# Patient Record
Sex: Female | Born: 1935 | Race: White | Hispanic: No | Smoking: Never smoker
Health system: Southern US, Community
[De-identification: ages and names within clinical notes are randomized; demographics above are authoritative.]

## PROBLEM LIST (undated history)

## (undated) DIAGNOSIS — M1990 Unspecified osteoarthritis, unspecified site: Secondary | ICD-10-CM

---

## 2009-11-30 ENCOUNTER — Encounter: Admission: RE | Admit: 2009-11-30 | Discharge: 2009-11-30 | Payer: Self-pay | Admitting: Orthopedic Surgery

## 2013-08-04 ENCOUNTER — Emergency Department (HOSPITAL_COMMUNITY)
Admission: EM | Admit: 2013-08-04 | Discharge: 2013-08-04 | Disposition: A | Discharge status: Home / Self Care | Payer: Medicare Other | Attending: Emergency Medicine | Admitting: Emergency Medicine

## 2013-08-04 ENCOUNTER — Emergency Department (HOSPITAL_COMMUNITY): Payer: Medicare Other

## 2013-08-04 ENCOUNTER — Encounter (HOSPITAL_COMMUNITY): Payer: Self-pay | Admitting: Emergency Medicine

## 2013-08-04 DIAGNOSIS — F0391 Unspecified dementia with behavioral disturbance: Secondary | ICD-10-CM | POA: Insufficient documentation

## 2013-08-04 DIAGNOSIS — R0989 Other specified symptoms and signs involving the circulatory and respiratory systems: Secondary | ICD-10-CM

## 2013-08-04 DIAGNOSIS — R6889 Other general symptoms and signs: Secondary | ICD-10-CM | POA: Insufficient documentation

## 2013-08-04 DIAGNOSIS — F03918 Unspecified dementia, unspecified severity, with other behavioral disturbance: Secondary | ICD-10-CM | POA: Insufficient documentation

## 2013-08-04 HISTORY — DX: Unspecified osteoarthritis, unspecified site: M19.90

## 2013-08-04 NOTE — ED Provider Notes (Signed)
CSN: [id number]     Arrival date & time 08/04/13  1854 History   First MD Initiated Contact with Patient 08/04/13 1855     Chief Complaint  Patient presents with  . Aspiration   (Consider location/radiation/quality/duration/timing/severity/associated sxs/prior Treatment) HPI Comments: 80 year old female with history of dementia sent from [facility] assisted living facility after an apparent possible aspiration. The patient was sitting down for a meal and had productive white "foam". The nursing staff noticed the patient seemed to be coughing up this foam. There is no obvious food. Could also been vomit. The patient does have trouble with certain foods and is not on a regular diet. The patient was encouraged to cough multiple times and afterwards seemed appear a little more ill but recovered and was her normal self. He called EMS as a precaution the major she is having okay swallowing and no aspiration. When she was sent to the ER the patient was in her normal mental status. The history is otherwise unobtainable the patient due to her dementia.   No past medical history on file. No past surgical history on file. No family history on file. History  Substance Use Topics  . Smoking status: Not on file  . Smokeless tobacco: Not on file  . Alcohol Use: Not on file   OB History   No data available     Review of Systems  Unable to perform ROS: Dementia    Allergies  Review of patient's allergies indicates not on file.  Home Medications  No current outpatient prescriptions on file. BP 126/72  Pulse 92  Temp(Src) 97.7 F (36.5 C) (Oral)  Resp 18  SpO2 97% Physical Exam  Nursing note and vitals reviewed. Constitutional: She appears well-developed and well-nourished.  HENT:  Head: Normocephalic and atraumatic.  Right Ear: External ear normal.  Left Ear: External ear normal.  Nose: Nose normal.  Eyes: Right eye exhibits no discharge. Left eye exhibits no discharge.   Cardiovascular: Normal rate, regular rhythm and normal heart sounds.   No murmur heard. Pulmonary/Chest: Effort normal and breath sounds normal.  Abdominal: Soft. There is no tenderness.  Neurological: She is alert. She is disoriented. GCS eye subscore is 4. GCS verbal subscore is 4. GCS motor subscore is 6.  Skin: Skin is warm and dry.    ED Course  Procedures (including critical care time) Labs Review Labs Reviewed - No data to display Imaging Review Dg Chest 2 View  08/04/2013   CLINICAL DATA:  Choking sensation  EXAM: CHEST  2 VIEW  COMPARISON:  None.  FINDINGS: There is a small calcified granuloma in the right upper lobe. Lungs are otherwise clear. Heart size and pulmonary vascularity are normal. No adenopathy. There is a moderate hiatal hernia.  IMPRESSION: Hiatal hernia present. No edema or consolidation. Small granuloma right upper lobe.   Electronically Signed   By: Bretta Bang M.D.   On: 08/04/2013 20:10    EKG Interpretation    Date/Time:  Thursday August 04 2013 19:19:39 EST Ventricular Rate:  92 PR Interval:  145 QRS Duration: 113 QT Interval:  373 QTC Calculation: 461 R Axis:   -58 Text Interpretation:  Sinus rhythm Left anterior fascicular block LVH with secondary repolarization abnormality Anterior Q waves, possibly due to LVH No old tracing to compare Confirmed by Ancil Dewan  MD, Hershel Corkery ([id number]) on 08/04/2013 8:23:42 PM            MDM   1. Choking episode    Patient is  well-appearing here and in no acute distress. Has sats greater than 94% on continuous pulse oximetry. Chest x-ray shows no signs of aspiration or pneumonia. Feel that at this time given her normal exam and no acute AMS, cough or vomiting here that she can go back to NH. Her EKG is abnormal but no sign of a cause of acute syncope or near-syncope. Her transient altered mental status seems related to prolonged coughing. She's not had any new respiratory symptoms here and I feel she is stable to go  back.    Audree CamelScott T Lugenia Assefa, MD 08/04/13 2033

## 2013-08-04 NOTE — ED Notes (Signed)
Patient coming in via EMS, Patient from dementia care facility, per staff patient had an episode of respiratory distress at the table, patient coughed and produce whitish phlegm with possible food in it, during this episode patient became unresponsive for a brief moment, after which she became quickly alert.  Patient appears in no acute distress at this time.  spo2 94% on ra

## 2013-08-04 NOTE — ED Notes (Signed)
Pt c/o pain in right foot, Pt stated that "it hurts when I put a shoe on"

## 2013-08-04 NOTE — Discharge Instructions (Signed)
Choking, Adult  Choking occurs when a food or object gets stuck in the throat or trachea, blocking the airway. If the airway is partly blocked, coughing will usually cause the food or object to come out. If the airway is completely blocked, immediate action is needed to help it come out. A complete airway blockage is life-threatening because it causes breathing to stop. Choking is a true medical emergency that requires fast, appropriate action by anyone available.  SIGNS OF AIRWAY BLOCKAGE  There is a partial airway blockage if you or the person who is choking is:    Able to breathe and speak.   Coughing loudly.   Making loud noises.  There is a complete airway blockage if you or the person who is choking is:    Unable to breathe.   Making soft or high-pitched sounds while breathing.   Unable to cough or coughing weakly, ineffectively, or silently.   Unable to cry, speak, or make sounds.   Turning blue.   Holding the neck with both arms. This is the universal sign of choking.  WHAT TO DO IF CHOKING OCCURS  If there is a partial airway blockage, allow coughing to clear the airway. Do not try to drink until the food or object comes out. If someone else has a partial airway blockage, do not interfere. Stay with him or her and watch for signs of complete airway blockage until the food or object comes out.   If there is a complete airway blockage or if there is a partial airway blockage and the food or object does not come out, perform abdominal thrusts (also referred to as the Heimlich maneuver). Abdominal thrusts are used to create an artificial cough to try to clear the airway. Performing abdominal thrusts is part of a series of steps that should be done to help someone who is choking. Abdominal thrusts are usually done by someone else, but if you are alone, you can perform abdominal thrusts on yourself. Follow the procedure below that best fits your situation.   IF SOMEONE ELSE IS CHOKING:  For a conscious  adult:   1. Ask the person whether he or she is choking. If the person nods, continue to step 2.  2. Stand or kneel behind the person and lean him or her forward slightly.  3. Make a fist with 1 hand, put your arms around the person, and grasp your fist with your other hand. Place the thumb side of your fist in the person's abdomen, just below the ribs.  4. Press inward and upward with both hands.  5. Repeat this maneuver until the object comes out and the person is able to breathe or until the person loses consciousness.  For an unconcious adult:  1. Shout for help. If someone responds, have him or her call local emergency services (911 in [place] If no one responds, call local emergency services yourself if possible.  2. Begin CPR, starting with compressions. Every time you open the airway to give rescue breaths, open the person's mouth. If you can see the food or object and it can be easily pulled out, remove it with your fingers.  3. After 5 cycles or 2 minutes of CPR, call local emergency services (911 in [place] if you or someone else did not already call.  For a conscious adult who is obese or in the later stages of pregnancy:  Abdominal thrusts may not be effective when helping people who are in the later stages   of pregnancy or who are obese. In these instances, chest thrusts can be used.   1. Ask the person whether he or she is choking. If the person nods and has signs of complete airway blockage, continue to step 2.  2. Stand behind the person and wrap your arms around his or her chest (with your arms under the person's armpits).  3. Make a fist with 1 hand. Place the thumb side of your fist on the middle of the person's breastbone.  4. Grab your fist with your other hand and thrust backward. Continue this until the object comes out or until the person becomes unconscious.  For an unconscious adult who is obese or in the later stages of pregnancy:   1. Shout for help. If someone responds, have him or her call  local emergency services (911 in [place] If no one responds, call local emergency services yourself if possible.  2. Begin CPR, starting with compressions. Every time you open the airway to give rescue breaths, open the person's mouth. If you can see the food or object and it can be easily pulled out, remove it with your fingers.  3. After 5 cycles or 2 minutes of CPR, call local emergency services (911 in [place] if you or someone else did not already call.  Note that abdominal thrusts (below the rib cage) should be used for a pregnant woman if possible. This should be possible until the later stages of pregnancy when there is no longer enough room between the enlarging uterus and the rib cage to perform the maneuver. At that point, chest thrusts must be used as described.  IF YOU ARE CHOKING:  1. Call local emergency services (911 in [place] if near a landline. Do not worry about communicating what is happening. Do not hang up the phone. Someone may be sent to help you anyway.  2. Make a fist with 1 hand. Put the thumb side of the fist against your stomach, just above the belly button and well below the breastbone. If you are pregnant or obese, put your fist on your chest instead, just below the breastbone and just above your lowest ribs.  3. Hold your fist with your other hand and bend over a hard surface, such as a table or chair.  4. Forcefully push your fist in and up.  5. Continue to do this until the food or object comes out.  PREVENTION   To be prepared if choking occurs, learn how to correctly perform abdominal thrusts and give CPR by taking a certified first-aid training course.   SEEK IMMEDIATE MEDICAL CARE IF:   You have a fever after choking stops.   You have problems breathing after choking stops.   You received the Heimlich maneuver.  MAKE SURE YOU:   Understand these instructions.   Watch your condition.   Get help right away if you are not doing well or get worse.  Document Released: 08/21/2004  Document Revised: 04/07/2012 Document Reviewed: 02/24/2012  [employer] Patient Information 2014 [employer].

## 2013-08-24 ENCOUNTER — Other Ambulatory Visit (HOSPITAL_COMMUNITY): Payer: Self-pay | Admitting: Geriatric Medicine

## 2013-08-24 DIAGNOSIS — R1310 Dysphagia, unspecified: Secondary | ICD-10-CM

## 2013-08-30 ENCOUNTER — Ambulatory Visit (HOSPITAL_COMMUNITY)
Admission: RE | Admit: 2013-08-30 | Discharge: 2013-08-30 | Disposition: A | Discharge status: Home / Self Care | Payer: Medicare Other | Source: Ambulatory Visit | Attending: Geriatric Medicine | Admitting: Geriatric Medicine

## 2013-08-30 DIAGNOSIS — E538 Deficiency of other specified B group vitamins: Secondary | ICD-10-CM | POA: Insufficient documentation

## 2013-08-30 DIAGNOSIS — D649 Anemia, unspecified: Secondary | ICD-10-CM | POA: Insufficient documentation

## 2013-08-30 DIAGNOSIS — R1310 Dysphagia, unspecified: Secondary | ICD-10-CM

## 2013-08-30 DIAGNOSIS — E039 Hypothyroidism, unspecified: Secondary | ICD-10-CM | POA: Insufficient documentation

## 2013-08-30 DIAGNOSIS — R627 Adult failure to thrive: Secondary | ICD-10-CM | POA: Insufficient documentation

## 2013-08-30 DIAGNOSIS — R1313 Dysphagia, pharyngeal phase: Secondary | ICD-10-CM | POA: Insufficient documentation

## 2013-08-30 DIAGNOSIS — R1311 Dysphagia, oral phase: Secondary | ICD-10-CM | POA: Insufficient documentation

## 2013-08-30 NOTE — Procedures (Addendum)
Objective Swallowing Evaluation: Modified Barium Swallowing Study  Patient Details  Name: Priscilla Jimenez MRN: [id number] Date of Birth: 1916-11-29  Today's Date: 08/30/2013 Time: 6962-9528 SLP Time Calculation (min): 42 min  Past Medical History:  Past Medical History  Diagnosis Date  . Arthritis    Past Surgical History: No past surgical history on file. HPI:  80 yo female referred by SLP for MBS due to concern pt may be aspirating.  Pt had episode recently where she was coughing and bringing up "foam".  Family present (daughter Decarla and her spouse) report pt holding phlegm in her mouth frequently and frequent "choking" episodes with and without po intake.  Pt PMH + for HOH, anemia, B12 deficiency, hypothyrodism, FTT, left fib plateau fx, frequent UTI hx, ? cogntiive deficts per ED report.  Pt was seen in the ED 08/04/13 for episode of "choking" and was released.  .     Assessment / Plan / Recommendation Clinical Impression  Dysphagia Diagnosis: Moderate oral phase dysphagia;Mild pharyngeal phase dysphagia (suspect component of esophageal issues)   Clinical impression: Pt presents with moderate oral, mild pharyngeal and suspected esophageal dysphagia without frank aspiration during tested.  Oral delays noted with pt allowing pudding/cracker bolus to sit in anterior oral cavity, effectively making eating laborious for this pt- ? d/t weakness. Pt with mild tongue base and vallecular residuals likely due to decreased intraoral bolus pressure - pt with delayed reflexive swallows effective to clear.  Pt did cough a few times during testing without barium visualized in larynx/trachea at that time.  Trace upper laryngeal infiltration noted x1 - uncertain of consistency - effectively cleared with cued cough.    Suspect pt's symptoms of bring up "foam/phlegm" may be consistent with esophageal issues including known hiatal hernia(dx on CXR 08/04/13).  Radiologist was not present during MBS to confirm  findings.  Also ? if pt is swallowing her saliva routinely, instructed her to make attempts to swallow saliva every few minutes.    Pt was unable to perform cued dry swallow in timely fashion and oral deficits were apparent with solids/puddings, recommend continue SLP to address dysphagia.  Educated family and pt thoroghly to findings and recommendations.  Pt may benefit from dedicated evaluation of esophagus if MD desires.      Treatment Recommendation  Defer treatment plan to SLP at (Comment) [facility])    Diet Recommendation Dysphagia 3 (Mechanical Soft);Thin liquid   Liquid Administration via: Cup;Straw Medication Administration: Whole meds with puree (if large crush - follow with liquids) Supervision: Patient able to self feed;Full supervision/cueing for compensatory strategies Compensations: Slow rate;Small sips/bites (intermittent cough and dry swallow) Postural Changes and/or Swallow Maneuvers: Seated upright 90 degrees;Upright 30-60 min after meal    Other  Recommendations Recommended Consults: Consider esophageal assessment Oral Care Recommendations: Oral care before and after PO   Follow Up Recommendations              SLP Swallow Goals     General Date of Onset: 08/30/13 HPI: 80 yo female referred by SLP for MBS due to concern pt may be aspirating.  Pt had episode recently where she was coughing and bringing up "foam".  Family present (daughter Deanette and her spouse) report pt holding phlegm in her mouth frequently and frequent "choking" episodes with and without po intake.  Pt PMH + for HOH, anemia, B12 deficiency, hypothyrodism, FTT, left fib plateau fx, frequent UTI hx, ? cogntiive deficts per ED report.  Pt was seen in the ED  08/04/13 for episode of "choking" and was released.  . Type of Study: Modified Barium Swallowing Study Reason for Referral: Objectively evaluate swallowing function Diet Prior to this Study: Regular Respiratory Status: Room air History of  Recent Intubation: No Behavior/Cognition: Alert;Cooperative;Hard of hearing;Pleasant mood Oral Cavity - Dentition: Dentures, top;Dentures, bottom (pt states dentures fit well) Oral Motor / Sensory Function: Within functional limits Self-Feeding Abilities: Able to feed self Patient Positioning: Upright in chair Baseline Vocal Quality: Hoarse;Low vocal intensity Volitional Cough: Strong Volitional Swallow: Unable to elicit Anatomy: Within functional limits Pharyngeal Secretions: Not observed secondary MBS    Reason for Referral Objectively evaluate swallowing function   Oral Phase Oral Preparation/Oral Phase Oral Phase: Impaired Oral - Nectar Oral - Nectar Teaspoon: Weak lingual manipulation;Delayed oral transit;Reduced posterior propulsion Oral - Nectar Cup: Weak lingual manipulation;Delayed oral transit;Lingual/palatal residue;Reduced posterior propulsion Oral - Nectar Straw: Not tested Oral - Thin Oral - Thin Teaspoon: Delayed oral transit;Reduced posterior propulsion;Weak lingual manipulation Oral - Thin Cup: Weak lingual manipulation;Lingual/palatal residue;Delayed oral transit;Reduced posterior propulsion Oral - Thin Straw: Weak lingual manipulation;Reduced posterior propulsion;Delayed oral transit;Lingual/palatal residue Oral - Solids Oral - Puree: Weak lingual manipulation;Reduced posterior propulsion;Delayed oral transit;Lingual/palatal residue Oral - Regular: Weak lingual manipulation;Impaired mastication;Reduced posterior propulsion;Delayed oral transit;Lingual/palatal residue Oral - Pill: Weak lingual manipulation;Delayed oral transit;Reduced posterior propulsion Oral Phase - Comment Oral Phase - Comment: pt allows boluses to be retained in anterior oral cavity with delays in transiting, mild tongue base and vallecular stasis noted due to weakness   Pharyngeal Phase Pharyngeal Phase Pharyngeal Phase: Impaired Pharyngeal - Nectar Pharyngeal - Nectar Teaspoon: Premature  spillage to valleculae;Pharyngeal residue - valleculae Pharyngeal - Nectar Cup: Premature spillage to valleculae;Pharyngeal residue - valleculae Pharyngeal - Thin Pharyngeal - Thin Teaspoon: Premature spillage to pyriform sinuses Pharyngeal - Thin Cup: Premature spillage to valleculae;Pharyngeal residue - valleculae Pharyngeal - Thin Straw: Premature spillage to pyriform sinuses;Pharyngeal residue - valleculae Pharyngeal - Solids Pharyngeal - Puree: Premature spillage to valleculae Pharyngeal - Regular: Premature spillage to valleculae Pharyngeal Phase - Comment Pharyngeal Comment: trace laryngeal penetration noted during study x1 to upper larynx- could not definitively determine consistency - suspect oral residuals premature spill into larynx, cued cough effective to remove trace penetrates, effective breath hold prior to swallow of thin noted intermittently   Cervical Esophageal Phase    GO    Cervical Esophageal Phase Cervical Esophageal Phase: Impaired Cervical Esophageal Phase - Comment Cervical Esophageal Comment: appearance of prominent cricopharyngeus, did not impair barium flow, distally barium appeared to clear slowly,  pt appeared with hiatal hernia (that was confirmed on previous CXR completed 08/04/13), barium tablet given with pudding appeared to lodge at distal esophagus (suspect at GE- portion of stomach above diaphragm)  WITHOUT pt sensation, multiple extra swallows of thin barium and warm water was not effective to clear tablet into stomach, advised pt that it would dissolve, SLP suspects pt's issues with coughing on phlegm may be consistent with esophageal deficits    Functional Assessment Tool Used: mbs. clinical judgement Functional Limitations: Swallowing Swallow Current Status ([id number]): At least 20 percent but less than 40 percent impaired, limited or restricted Swallow Goal Status [phone]([id number]): At least 20 percent but less than 40 percent impaired, limited or  restricted Swallow Discharge Status [phone](G8998): At least 20 percent but less than 40 percent impaired, limited or restricted    Donavan Burnetamara Joshua Zeringue, MS [facility] SLP [phone]

## 2013-10-26 ENCOUNTER — Emergency Department (HOSPITAL_COMMUNITY)
Admission: EM | Admit: 2013-10-26 | Discharge: 2013-10-26 | Disposition: A | Discharge status: Home / Self Care | Payer: Medicare Other | Attending: Emergency Medicine | Admitting: Emergency Medicine

## 2013-10-26 ENCOUNTER — Encounter (HOSPITAL_COMMUNITY): Payer: Self-pay | Admitting: Emergency Medicine

## 2013-10-26 DIAGNOSIS — Z8739 Personal history of other diseases of the musculoskeletal system and connective tissue: Secondary | ICD-10-CM | POA: Diagnosis not present

## 2013-10-26 DIAGNOSIS — R112 Nausea with vomiting, unspecified: Secondary | ICD-10-CM | POA: Diagnosis not present

## 2013-10-26 DIAGNOSIS — R197 Diarrhea, unspecified: Secondary | ICD-10-CM | POA: Insufficient documentation

## 2013-10-26 DIAGNOSIS — Z79899 Other long term (current) drug therapy: Secondary | ICD-10-CM | POA: Insufficient documentation

## 2013-10-26 LAB — CBC WITH DIFFERENTIAL/PLATELET
BASOS PCT: 0 % (ref 0–1)
Basophils Absolute: 0 10*3/uL (ref 0.0–0.1)
Eosinophils Absolute: 0.1 10*3/uL (ref 0.0–0.7)
Eosinophils Relative: 1 % (ref 0–5)
HEMATOCRIT: 44.4 % (ref 36.0–46.0)
HEMOGLOBIN: 15.2 g/dL — AB (ref 12.0–15.0)
LYMPHS ABS: 0.9 10*3/uL (ref 0.7–4.0)
LYMPHS PCT: 8 % — AB (ref 12–46)
MCH: 30.8 pg (ref 26.0–34.0)
MCHC: 34.2 g/dL (ref 30.0–36.0)
MCV: 90.1 fL (ref 78.0–100.0)
Monocytes Absolute: 0.6 10*3/uL (ref 0.1–1.0)
Monocytes Relative: 6 % (ref 3–12)
Neutro Abs: 8.9 10*3/uL — ABNORMAL HIGH (ref 1.7–7.7)
Neutrophils Relative %: 85 % — ABNORMAL HIGH (ref 43–77)
PLATELETS: 145 10*3/uL — AB (ref 150–400)
RBC: 4.93 MIL/uL (ref 3.87–5.11)
RDW: 15 % (ref 11.5–15.5)
WBC: 10.5 10*3/uL (ref 4.0–10.5)

## 2013-10-26 LAB — BASIC METABOLIC PANEL
BUN: 29 mg/dL — ABNORMAL HIGH (ref 6–23)
CO2: 23 meq/L (ref 19–32)
CREATININE: 0.83 mg/dL (ref 0.50–1.10)
Calcium: 9.5 mg/dL (ref 8.4–10.5)
Chloride: 100 mEq/L (ref 96–112)
GFR, EST AFRICAN AMERICAN: 67 mL/min — AB (ref >90)
GFR, EST NON AFRICAN AMERICAN: 58 mL/min — AB (ref >90)
GLUCOSE: 80 mg/dL (ref 70–99)
Potassium: 3.9 mEq/L (ref 3.7–5.3)
Sodium: 137 mEq/L (ref 137–147)

## 2013-10-26 MED ORDER — ONDANSETRON HCL 4 MG/2ML IJ SOLN
4.0000 mg | Freq: Once | INTRAMUSCULAR | Status: AC
  Administered 2013-10-26: 4 mg via INTRAVENOUS
  Filled 2013-10-26: qty 2

## 2013-10-26 MED ORDER — SODIUM CHLORIDE 0.9 % IV SOLN
1000.0000 mL | Freq: Once | INTRAVENOUS | Status: AC
  Administered 2013-10-26: 1000 mL via INTRAVENOUS

## 2013-10-26 MED ORDER — SODIUM CHLORIDE 0.9 % IV SOLN
1000.0000 mL | INTRAVENOUS | Status: DC
  Administered 2013-10-26: 1000 mL via INTRAVENOUS

## 2013-10-26 MED ORDER — ONDANSETRON 8 MG PO TBDP: 8.0000 mg | ORAL_TABLET | Freq: Three times a day (TID) | ORAL | Status: DC | PRN

## 2013-10-26 NOTE — ED Notes (Signed)
MD at bedside. 

## 2013-10-26 NOTE — ED Notes (Signed)
Pt presents with NAD. DNR. [facility]. Staff reports pt presents with symptoms of N/V/D x 2 days.

## 2013-10-26 NOTE — ED Provider Notes (Signed)
CSN: [id number]     Arrival date & time 10/26/13  1112 History   First MD Initiated Contact with Patient 10/26/13 1117     Chief Complaint  Patient presents with  . DNR   . Nausea  . Emesis  . Diarrhea      HPI Patient presents with nausea vomiting diarrhea over the past 2 days.  She resides at a senior living facility where other residents about similar symptoms.  No fevers or chills.  No abdominal discomfort or pain at this time.  No chest pain shortness of breath.  No fevers or chills.  She has no other complaints.  She states she feels generally weak.  Past Medical History  Diagnosis Date  . Arthritis    History reviewed. No pertinent past surgical history. No family history on file. History  Substance Use Topics  . Smoking status: Never Smoker   . Smokeless tobacco: Never Used  . Alcohol Use: 0.0 oz/week    0 Glasses of wine per week   OB History   Grav Para Term Preterm Abortions TAB SAB Ect Mult Living                 Review of Systems  All other systems reviewed and are negative.      Allergies  Review of patient's allergies indicates no known allergies.  Home Medications   Current Outpatient Rx  Name  Route  Sig  Dispense  Refill  . acetaminophen (TYLENOL) 500 MG tablet   Oral   Take 500 mg by mouth every 8 (eight) hours as needed for mild pain or fever.         . B Complex-Biotin-FA (SUPER B-50 COMPLEX PO)   Oral   Take 1 tablet by mouth daily.         Marland Kitchen B-Complex TABS   Oral   Take 1 tablet by mouth daily.         . calcium carbonate (OS-CAL) 600 MG TABS tablet   Oral   Take 600 mg by mouth daily after breakfast.         . cholecalciferol (VITAMIN D) 1000 UNITS tablet   Oral   Take 1,000 Units by mouth daily.         . Cranberry 475 MG CAPS   Oral   Take 1 capsule by mouth daily.         Marland Kitchen guaifenesin (ROBITUSSIN) 100 MG/5ML syrup   Oral   Take 10 mLs by mouth every 6 (six) hours as needed for cough or congestion.          Marland Kitchen levothyroxine (SYNTHROID, LEVOTHROID) 50 MCG tablet   Oral   Take 1 tablet by mouth daily before breakfast.          . lisinopril (PRINIVIL,ZESTRIL) 2.5 MG tablet   Oral   Take 2.5 mg by mouth daily.         Marland Kitchen loratadine (CLARITIN) 10 MG tablet   Oral   Take 10 mg by mouth daily as needed for allergies.         Marland Kitchen LORazepam (ATIVAN) 0.5 MG tablet   Oral   Take 0.25 mg by mouth at bedtime.          . naphazoline-pheniramine (NAPHCON-A) 0.025-0.3 % ophthalmic solution   Both Eyes   Place 2 drops into both eyes 3 (three) times daily.         Marland Kitchen neomycin-bacitracin-polymyxin (NEOSPORIN) ointment   Topical   Apply  1 application topically daily. apply to eye         . polyethylene glycol (MIRALAX / GLYCOLAX) packet   Oral   Take 17 g by mouth daily with breakfast.         . protein supplement (PROMOD) POWD   Oral   Take 1 scoop by mouth 3 (three) times daily with meals.         . sertraline (ZOLOFT) 100 MG tablet   Oral   Take 100 mg by mouth daily.         . ondansetron (ZOFRAN ODT) 8 MG disintegrating tablet   Oral   Take 1 tablet (8 mg total) by mouth every 8 (eight) hours as needed for nausea or vomiting.   12 tablet   0    BP 125/71  Pulse 74  Temp(Src) 97.5 F (36.4 C) (Oral)  Resp 14  SpO2 97% Physical Exam  Nursing note and vitals reviewed. Constitutional: She is oriented to person, place, and time. She appears well-developed and well-nourished. No distress.  HENT:  Head: Normocephalic and atraumatic.  Eyes: EOM are normal.  Neck: Normal range of motion.  Cardiovascular: Normal rate, regular rhythm and normal heart sounds.   Pulmonary/Chest: Effort normal and breath sounds normal.  Abdominal: Soft. She exhibits no distension. There is no tenderness.  Musculoskeletal: Normal range of motion.  Neurological: She is alert and oriented to person, place, and time.  Skin: Skin is warm and dry.  Psychiatric: She has a normal mood and  affect. Judgment normal.    ED Course  Procedures (including critical care time) Labs Review Labs Reviewed  CBC WITH DIFFERENTIAL - Abnormal; Notable for the following:    Hemoglobin 15.2 (*)    Platelets 145 (*)    Neutrophils Relative % 85 (*)    Neutro Abs 8.9 (*)    Lymphocytes Relative 8 (*)    All other components within normal limits  BASIC METABOLIC PANEL - Abnormal; Notable for the following:    BUN 29 (*)    GFR calc non Af Amer 58 (*)    GFR calc Af Amer 67 (*)    All other components within normal limits   Imaging Review No results found.   EKG Interpretation None      MDM   Final diagnoses:  Nausea vomiting and diarrhea    Patient feels better after IV fluids.  Discharge home in good condition    Lyanne CoKevin M Christin Moline, MD 10/26/13 [phone]

## 2013-10-26 NOTE — ED Notes (Signed)
Bed: [id number] Expected date:  Expected time:  Means of arrival:  Comments: Old person, NVD

## 2013-11-06 ENCOUNTER — Emergency Department (HOSPITAL_COMMUNITY)
Admission: EM | Admit: 2013-11-06 | Discharge: 2013-11-06 | Disposition: A | Discharge status: Home / Self Care | Payer: Medicare Other | Attending: Emergency Medicine | Admitting: Emergency Medicine

## 2013-11-06 ENCOUNTER — Encounter (HOSPITAL_COMMUNITY): Payer: Self-pay | Admitting: Emergency Medicine

## 2013-11-06 DIAGNOSIS — R5383 Other fatigue: Secondary | ICD-10-CM

## 2013-11-06 DIAGNOSIS — F0390 Unspecified dementia without behavioral disturbance: Secondary | ICD-10-CM | POA: Insufficient documentation

## 2013-11-06 DIAGNOSIS — R5381 Other malaise: Secondary | ICD-10-CM | POA: Diagnosis not present

## 2013-11-06 DIAGNOSIS — Z8739 Personal history of other diseases of the musculoskeletal system and connective tissue: Secondary | ICD-10-CM | POA: Diagnosis not present

## 2013-11-06 DIAGNOSIS — Z79899 Other long term (current) drug therapy: Secondary | ICD-10-CM | POA: Insufficient documentation

## 2013-11-06 DIAGNOSIS — Z792 Long term (current) use of antibiotics: Secondary | ICD-10-CM | POA: Diagnosis not present

## 2013-11-06 DIAGNOSIS — W06XXXA Fall from bed, initial encounter: Secondary | ICD-10-CM | POA: Insufficient documentation

## 2013-11-06 DIAGNOSIS — Y9210 Unspecified residential institution as the place of occurrence of the external cause: Secondary | ICD-10-CM | POA: Insufficient documentation

## 2013-11-06 DIAGNOSIS — Y9389 Activity, other specified: Secondary | ICD-10-CM | POA: Diagnosis not present

## 2013-11-06 DIAGNOSIS — Z043 Encounter for examination and observation following other accident: Secondary | ICD-10-CM | POA: Insufficient documentation

## 2013-11-06 DIAGNOSIS — I499 Cardiac arrhythmia, unspecified: Secondary | ICD-10-CM | POA: Diagnosis not present

## 2013-11-06 DIAGNOSIS — S0990XA Unspecified injury of head, initial encounter: Secondary | ICD-10-CM | POA: Diagnosis present

## 2013-11-06 DIAGNOSIS — W19XXXA Unspecified fall, initial encounter: Secondary | ICD-10-CM

## 2013-11-06 NOTE — ED Notes (Signed)
[facility]. Patient slip from chair and staff wanted patient to get checked. Patient denies pain or any other complaints at this time.

## 2013-11-06 NOTE — Discharge Instructions (Signed)
Your provider(s) today do not feel you have any concerning injury from your fall.  Please follow up with your doctor.    Fall Prevention and Home Safety Falls cause injuries and can affect all age groups. It is possible to use preventive measures to significantly decrease the likelihood of falls. There are many simple measures which can make your home safer and prevent falls. OUTDOORS  Repair cracks and edges of walkways and driveways.  Remove high doorway thresholds.  Trim shrubbery on the main path into your home.  Have good outside lighting.  Clear walkways of tools, rocks, debris, and clutter.  Check that handrails are not broken and are securely fastened. Both sides of steps should have handrails.  Have leaves, snow, and ice cleared regularly.  Use sand or salt on walkways during winter months.  In the garage, clean up grease or oil spills. BATHROOM  Install night lights.  Install grab bars by the toilet and in the tub and shower.  Use non-skid mats or decals in the tub or shower.  Place a plastic non-slip stool in the shower to sit on, if needed.  Keep floors dry and clean up all water on the floor immediately.  Remove soap buildup in the tub or shower on a regular basis.  Secure bath mats with non-slip, double-sided rug tape.  Remove throw rugs and tripping hazards from the floors. BEDROOMS  Install night lights.  Make sure a bedside light is easy to reach.  Do not use oversized bedding.  Keep a telephone by your bedside.  Have a firm chair with side arms to use for getting dressed.  Remove throw rugs and tripping hazards from the floor. KITCHEN  Keep handles on pots and pans turned toward the center of the stove. Use back burners when possible.  Clean up spills quickly and allow time for drying.  Avoid walking on wet floors.  Avoid hot utensils and knives.  Position shelves so they are not too high or low.  Place commonly used objects within  easy reach.  If necessary, use a sturdy step stool with a grab bar when reaching.  Keep electrical cables out of the way.  Do not use floor polish or wax that makes floors slippery. If you must use wax, use non-skid floor wax.  Remove throw rugs and tripping hazards from the floor. STAIRWAYS  Never leave objects on stairs.  Place handrails on both sides of stairways and use them. Fix any loose handrails. Make sure handrails on both sides of the stairways are as long as the stairs.  Check carpeting to make sure it is firmly attached along stairs. Make repairs to worn or loose carpet promptly.  Avoid placing throw rugs at the top or bottom of stairways, or properly secure the rug with carpet tape to prevent slippage. Get rid of throw rugs, if possible.  Have an electrician put in a light switch at the top and bottom of the stairs. OTHER FALL PREVENTION TIPS  Wear low-heel or rubber-soled shoes that are supportive and fit well. Wear closed toe shoes.  When using a stepladder, make sure it is fully opened and both spreaders are firmly locked. Do not climb a closed stepladder.  Add color or contrast paint or tape to grab bars and handrails in your home. Place contrasting color strips on first and last steps.  Learn and use mobility aids as needed. Install an electrical emergency response system.  Turn on lights to avoid dark areas. Replace  light bulbs that burn out immediately. Get light switches that glow.  Arrange furniture to create clear pathways. Keep furniture in the same place.  Firmly attach carpet with non-skid or double-sided tape.  Eliminate uneven floor surfaces.  Select a carpet pattern that does not visually hide the edge of steps.  Be aware of all pets. OTHER HOME SAFETY TIPS  Set the water temperature for 120 F (48.8 C).  Keep emergency numbers on or near the telephone.  Keep smoke detectors on every level of the home and near sleeping areas. Document  Released: 07/04/2002 Document Revised: 01/13/2012 Document Reviewed: 10/03/2011 [facility] Patient Information 2014 [place], [place].

## 2013-11-06 NOTE — ED Notes (Signed)
[facility] called  

## 2013-11-06 NOTE — ED Notes (Signed)
Bed: [id number] Expected date: 11/06/13 Expected time: 12:55 AM Means of arrival: Ambulance Comments: Fall, no complaint

## 2013-11-06 NOTE — ED Provider Notes (Signed)
Medical screening examination/treatment/procedure(s) were conducted as a shared visit with non-physician practitioner(s) and myself.  I personally evaluated the patient during the encounter.   EKG Interpretation None     Patient seen with PA.  Patient had a slip out of a wheelchair.  No injuries noted in the emergency department.  Patient has dementia and is a poor historian.  I do not feel she requires additional imaging  Priscilla Jimenez M Inioluwa Boulay, MD 11/06/13 [phone]

## 2013-11-06 NOTE — ED Provider Notes (Signed)
CSN: [id number]     Arrival date & time 11/06/13  0109 History   First MD Initiated Contact with Patient 11/06/13 0150     Chief Complaint  Patient presents with  . Fall   HPI  History provided by the patient and nursing home report. Patient was found by staff after she had slipped out of her bed and was on the ground. Nursing home states that patient was recently moved to a different room with a smaller twin size bed and thinks this may have contributed to her rolling and sliding out of the bed. When they checked on her she did mention some pain to the head area. They did not notice any marks, swelling or bruising on her head or body. Patient normally is wheelchair-bound and does not bear any weight. There was no other changes in her baseline mentation. No other aggravating or alleviating factors. No other associated symptoms.   Past Medical History  Diagnosis Date  . Arthritis    History reviewed. No pertinent past surgical history. No family history on file. History  Substance Use Topics  . Smoking status: Never Smoker   . Smokeless tobacco: Never Used  . Alcohol Use: No   OB History   Grav Para Term Preterm Abortions TAB SAB Ect Mult Living                 Review of Systems  Unable to perform ROS: Dementia      Allergies  Review of patient's allergies indicates no known allergies.  Home Medications   Current Outpatient Rx  Name  Route  Sig  Dispense  Refill  . acetaminophen (TYLENOL) 500 MG tablet   Oral   Take 500 mg by mouth every 8 (eight) hours as needed for mild pain or fever.         . calcium carbonate (OS-CAL) 600 MG TABS tablet   Oral   Take 600 mg by mouth daily after breakfast.         . cholecalciferol (VITAMIN D) 1000 UNITS tablet   Oral   Take 1,000 Units by mouth daily.         Marland Kitchen. guaifenesin (ROBITUSSIN) 100 MG/5ML syrup   Oral   Take 10 mLs by mouth every 6 (six) hours as needed for cough or congestion.         Marland Kitchen. loratadine  (CLARITIN) 10 MG tablet   Oral   Take 10 mg by mouth daily as needed for allergies.         Marland Kitchen. LORazepam (ATIVAN) 0.5 MG tablet   Oral   Take 0.25 mg by mouth at bedtime.          . mineral oil external liquid   Both Ears   Place 1 application into both ears 2 (two) times a week. 2 drops in each ear twice weekly on Mon and Thur         . naphazoline-pheniramine (NAPHCON-A) 0.025-0.3 % ophthalmic solution   Both Eyes   Place 2 drops into both eyes 3 (three) times daily.         Marland Kitchen. neomycin-bacitracin-polymyxin (NEOSPORIN) ointment   Topical   Apply 1 application topically daily. apply to eye         . protein supplement (PROMOD) POWD   Oral   Take 1 scoop by mouth 3 (three) times daily with meals.          BP 146/84  Pulse 78  Temp(Src) 97.3 F (36.3  C) (Oral)  Resp 18  SpO2 96% Physical Exam  Nursing note and vitals reviewed. Constitutional: She is oriented to person, place, and time. She appears well-developed and well-nourished. No distress.  HENT:  Head: Normocephalic and atraumatic.  Eyes: Conjunctivae and EOM are normal. Pupils are equal, round, and reactive to light.  Cardiovascular: Normal rate.  An irregular rhythm present.  Pulmonary/Chest: Effort normal and breath sounds normal. No respiratory distress. She has no wheezes. She has no rales.  Abdominal: Soft. There is no tenderness. There is no rebound and no guarding.  Musculoskeletal: Normal range of motion. She exhibits no edema and no tenderness.  No gross deformities to extremities.  Pt with bilateral weakness in lower legs.  Pt initially stiff with passive ROM in left knee but I am able to flex it to 45 deg in bed.  No popping or crepitus.  No pain with movements or palpation.    Neurological: She is alert and oriented to person, place, and time.  Skin: Skin is warm and dry. No rash noted.  Psychiatric: She has a normal mood and affect. Her behavior is normal.    ED Course  Procedures    COORDINATION OF CARE:  Nursing notes reviewed. Vital signs reviewed. Initial pt interview and examination performed.   Filed Vitals:   11/06/13 0112  BP: 146/84  Pulse: 78  Temp: 97.3 F (36.3 C)  TempSrc: Oral  Resp: 18  SpO2: 96%    1:56 AM-patient seen and evaluated. No signs for any concerning injuries. She appears well and at baseline.   Pt also seen and evaluated by Attending physician who agrees with plan to have pt return to care facility.     MDM   Final diagnoses:  Gilford Silvius, PA-C 11/06/13 [phone]

## 2013-11-06 NOTE — ED Notes (Signed)
Patient is alert and oriented x3.  She was given DC instructions and follow up visit instructions.  She was DC via stretcher to assisted living home.  SHe was not showing any signs of distress on DC

## 2014-04-27 DEATH — deceased

## 2014-08-13 IMAGING — CR DG CHEST 2V
1 series · 1 of 1 positions shown · non-contrast
Comparison: None.

CLINICAL DATA: Choking sensation

EXAM:
CHEST  2 VIEW

[w chest lat]
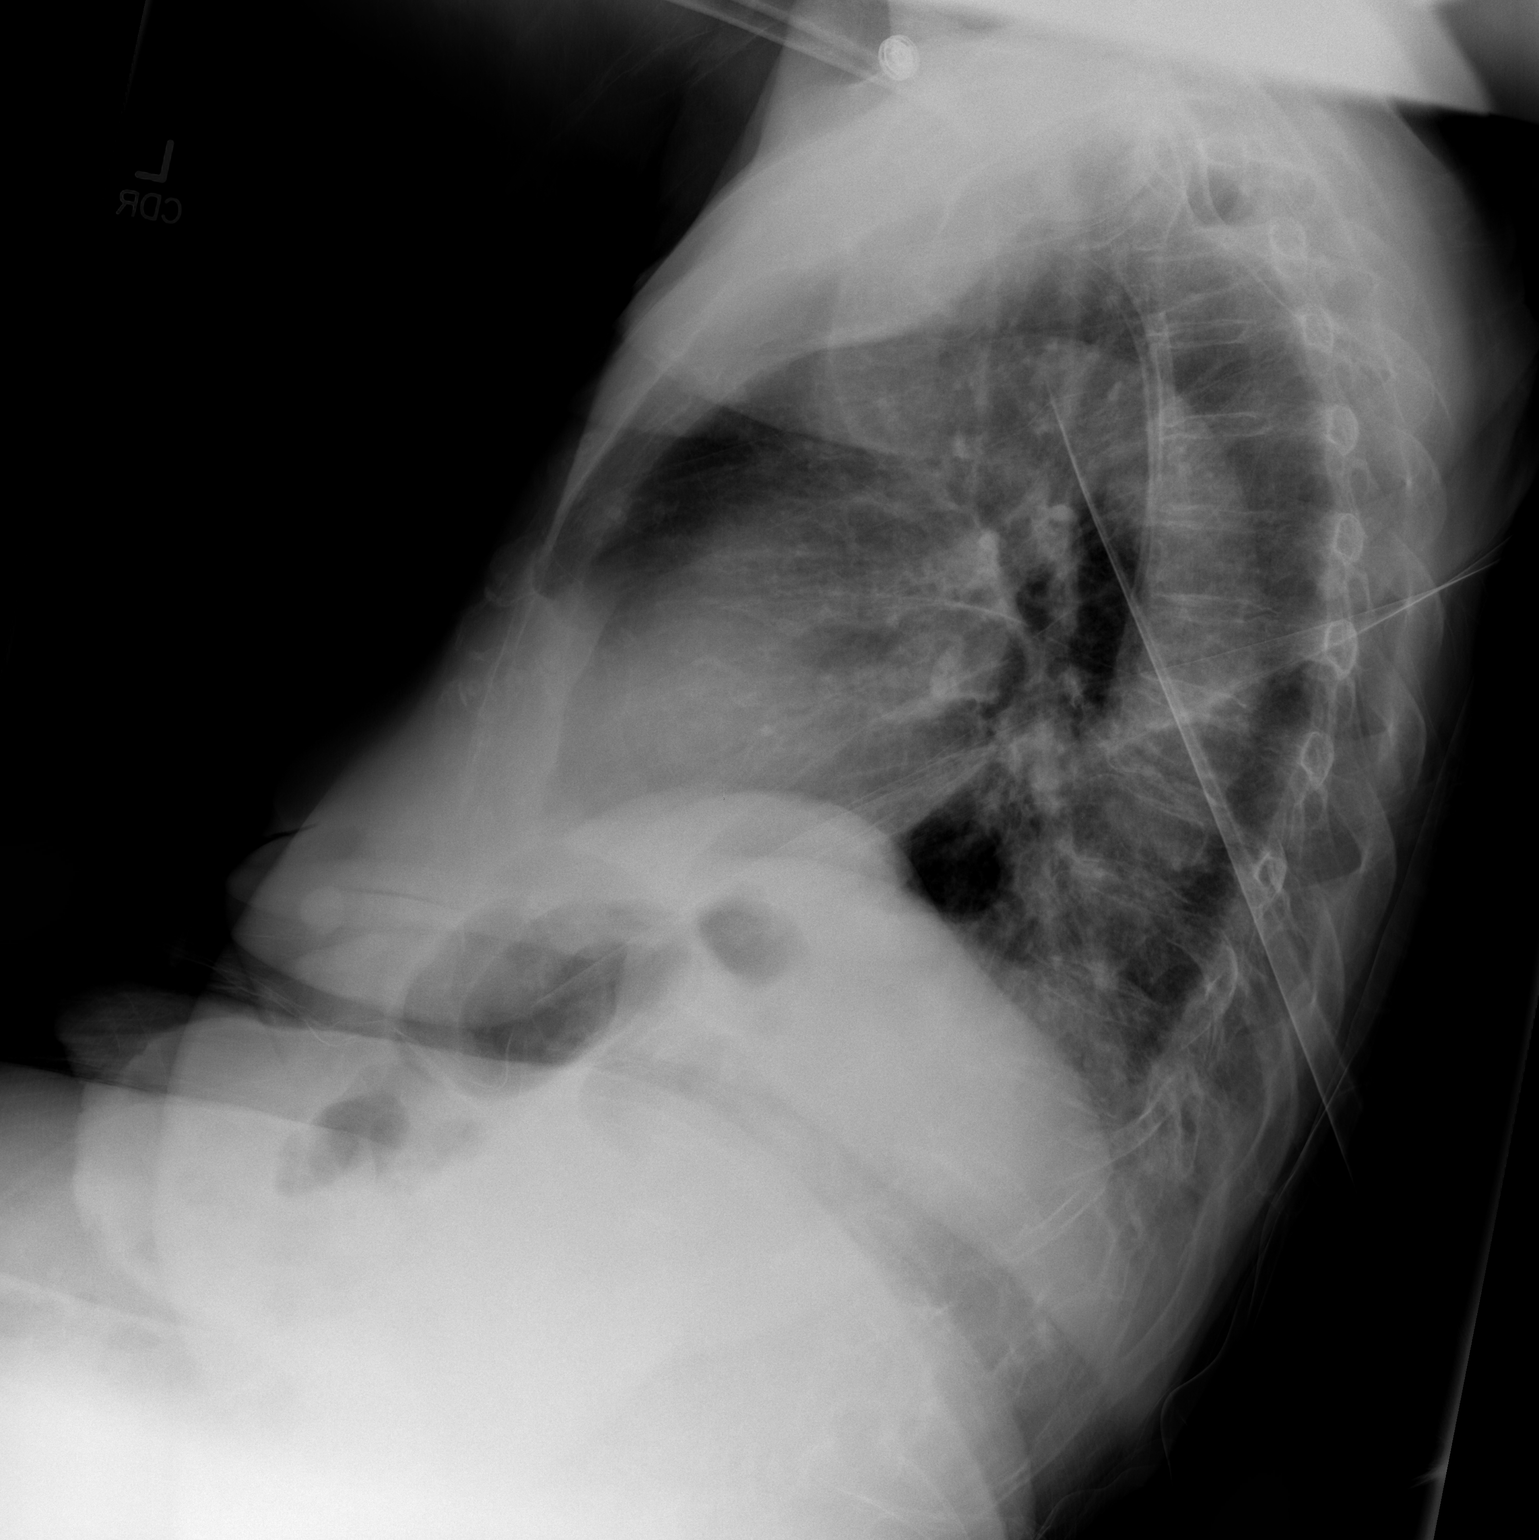

[1 of 1 positions shown; findings below may reference images not displayed]

FINDINGS: There is a small calcified granuloma in the right upper lobe. Lungs
are otherwise clear. Heart size and pulmonary vascularity are
normal. No adenopathy. There is a moderate hiatal hernia.
IMPRESSION: Hiatal hernia present. No edema or consolidation. Small granuloma
right upper lobe.
